# Patient Record
Sex: Female | Born: 1965 | Race: White | Hispanic: Yes | Smoking: Never smoker
Health system: Southern US, Community
[De-identification: ages and names within clinical notes are randomized; demographics above are authoritative.]

## PROBLEM LIST (undated history)

## (undated) HISTORY — PX: TUBAL LIGATION: SHX77

---

## 2014-11-16 ENCOUNTER — Other Ambulatory Visit: Payer: Self-pay | Admitting: Obstetrics and Gynecology

## 2014-11-16 DIAGNOSIS — Z1231 Encounter for screening mammogram for malignant neoplasm of breast: Secondary | ICD-10-CM

## 2015-01-05 ENCOUNTER — Ambulatory Visit (HOSPITAL_COMMUNITY)
Admission: RE | Admit: 2015-01-05 | Discharge: 2015-01-05 | Disposition: A | Discharge status: Home / Self Care | Payer: Self-pay | Source: Ambulatory Visit | Attending: Obstetrics and Gynecology | Admitting: Obstetrics and Gynecology

## 2015-01-05 ENCOUNTER — Encounter (HOSPITAL_COMMUNITY): Payer: Self-pay

## 2015-01-05 VITALS — BP 108/70 | Temp 97.9°F | Ht 63.0 in | Wt 131.6 lb

## 2015-01-05 DIAGNOSIS — Z1239 Encounter for other screening for malignant neoplasm of breast: Secondary | ICD-10-CM

## 2015-01-05 DIAGNOSIS — Z1231 Encounter for screening mammogram for malignant neoplasm of breast: Secondary | ICD-10-CM

## 2015-01-05 NOTE — Progress Notes (Signed)
No complaints today.  Pap Smear:  Pap smear not completed today. Last Pap smear was 11/07/2014 at the free cervical cancer screening at the [facility] and normal. Per patient has no history of an abnormal Pap smear. No Pap smear results in EPIC.  Physical exam: Breasts Breasts symmetrical. No skin abnormalities bilateral breasts. No nipple retraction bilateral breasts. No nipple discharge bilateral breasts. No lymphadenopathy. No lumps palpated bilateral breasts. No complaints of pain or tenderness on exam. Patient escorted to mammography for a screening mammogram.        Pelvic/Bimanual No Pap smear completed today since last Pap smear was 11/07/2014. Pap smear not indicated per BCCCP guidelines.

## 2015-01-05 NOTE — Patient Instructions (Signed)
Explained to Jamie ShoresMaria Bates that she did not need a Pap smear today due to last Pap smear was 11/07/2014. Let her know BCCCP will cover Pap smears every 3 years unless has a history of abnormal Pap smears. Let patient know the Breast Center will follow up with her within the next couple weeks with results of mammogram by letter or phone. Jamie ShoresMaria Schlee verbalized understanding. Patient escorted to mammography for a screening mammogram.  Leonie Amacher, Kathaleen Maserhristine Poll, RN 11:45 AM

## 2015-01-15 ENCOUNTER — Encounter (HOSPITAL_COMMUNITY): Payer: Self-pay

## 2016-01-09 ENCOUNTER — Other Ambulatory Visit: Payer: Self-pay | Admitting: Obstetrics and Gynecology

## 2016-01-09 DIAGNOSIS — Z1231 Encounter for screening mammogram for malignant neoplasm of breast: Secondary | ICD-10-CM

## 2016-01-11 ENCOUNTER — Ambulatory Visit
Admission: RE | Admit: 2016-01-11 | Discharge: 2016-01-11 | Disposition: A | Discharge status: Home / Self Care | Payer: No Typology Code available for payment source | Source: Ambulatory Visit | Attending: Obstetrics and Gynecology | Admitting: Obstetrics and Gynecology

## 2016-01-11 ENCOUNTER — Encounter (HOSPITAL_COMMUNITY): Payer: Self-pay

## 2016-01-11 ENCOUNTER — Ambulatory Visit (HOSPITAL_COMMUNITY)
Admission: RE | Admit: 2016-01-11 | Discharge: 2016-01-11 | Disposition: A | Discharge status: Home / Self Care | Payer: Self-pay | Source: Ambulatory Visit | Attending: Obstetrics and Gynecology | Admitting: Obstetrics and Gynecology

## 2016-01-11 VITALS — BP 110/68 | Temp 97.8°F | Ht 63.0 in | Wt 140.0 lb

## 2016-01-11 DIAGNOSIS — Z1239 Encounter for other screening for malignant neoplasm of breast: Secondary | ICD-10-CM

## 2016-01-11 DIAGNOSIS — Z1231 Encounter for screening mammogram for malignant neoplasm of breast: Secondary | ICD-10-CM

## 2016-01-11 NOTE — Patient Instructions (Signed)
Educational materials on breast self awareness given. Explained to Jamie ShoresMaria Bates that she did not need a Pap smear today due to last Pap smear was 11/07/2014. Let her know BCCCP will cover Pap smears and HPV typing every 5 years unless has a history of abnormal Pap smears. [facility]. Appointment scheduled for Thursday, January 11, 2016 at 1030. Patient aware of appointment and will be there. Let patient know the Breast Center will follow up with her within the next couple weeks with results of mammogram by letter or phone. Jamie ShoresMaria Bates verbalized understanding.  Toshie Demelo, Kathaleen Maserhristine Poll, RN 10:02 AM

## 2016-01-11 NOTE — Progress Notes (Signed)
No complaints today.  Pap Smear:  Pap smear not completed today. Last Pap smear was 11/07/2014 at the free [facility] cancer screening [facility] and normal. Per patient has no history of an abnormal Pap smear. No Pap smear results in EPIC.  Physical exam: Breasts Breasts symmetrical. No skin abnormalities bilateral breasts. No nipple retraction bilateral breasts. No nipple discharge bilateral breasts. No lymphadenopathy. No lumps palpated bilateral breasts. No complaints of pain or tenderness on exam. [facility] mamm[facility]. Appointment scheduled for Thursday, January 11, 2016 at 1030.        Pelvic/Bimanual No Pap smear completed today since last Pap smear was 11/07/2014. Pap smear not indicated per BCCCP guidelines.   Smoking History: Patient has never smoked.  Patient Navigation: Patient education provided. [facility].

## 2016-01-12 ENCOUNTER — Other Ambulatory Visit: Payer: Self-pay | Admitting: Obstetrics and Gynecology

## 2016-01-12 DIAGNOSIS — R928 Other abnormal and inconclusive findings on diagnostic imaging of breast: Secondary | ICD-10-CM

## 2016-01-30 ENCOUNTER — Ambulatory Visit
Admission: RE | Admit: 2016-01-30 | Discharge: 2016-01-30 | Disposition: A | Discharge status: Home / Self Care | Payer: No Typology Code available for payment source | Source: Ambulatory Visit | Attending: Obstetrics and Gynecology | Admitting: Obstetrics and Gynecology

## 2016-01-30 DIAGNOSIS — R928 Other abnormal and inconclusive findings on diagnostic imaging of breast: Secondary | ICD-10-CM

## 2016-02-28 ENCOUNTER — Telehealth: Payer: Self-pay

## 2016-02-28 NOTE — Telephone Encounter (Signed)
Called per interpreter Marly Adams to see if interested in [facility] Program. Left message. 

## 2016-02-29 ENCOUNTER — Telehealth: Payer: Self-pay

## 2016-02-29 NOTE — Telephone Encounter (Signed)
Called and reminded of appointment at [facility] at 8:45 AM.

## 2016-03-29 ENCOUNTER — Other Ambulatory Visit: Payer: Self-pay

## 2016-03-29 ENCOUNTER — Ambulatory Visit: Payer: Self-pay

## 2016-03-29 VITALS — BP 106/76 | HR 64 | Temp 97.7°F | Resp 14 | Ht 63.0 in | Wt 134.1 lb

## 2016-03-29 DIAGNOSIS — Z0000 Encounter for general adult medical examination without abnormal findings: Secondary | ICD-10-CM

## 2016-03-29 NOTE — Progress Notes (Signed)
[facility] and is currently a [facility] patient effective 01/11/2016 with interpreter Jamie Bates.   Clinical Measurements: Patient is 5 ft. 3 inches, weight 134.1 lbs, and BMI 23.8.   Medical History: Patient has no history of high cholesterol. Patient does not have a history of hypertension or diabetes. Per patient no diagnosed history of coronary heart disease, heart attack, heart failure, stroke/TIA, vascular disease or congenital heart defects.   Blood Pressure, Self-measurement: Patient states has no reason to check Blood pressure.  Nutrition Assessment: Patient stated that eats 2 to 3 fruits every day. Patient states she eats 2 servings of vegetables a day. Per patient states does eat 3 or more ounces of whole grains daily. Patient stated doesn't eat two or more servings of fish weekly. Patient says that does not like fish but when explored more does eat tuna, salmon and sardines on occasion. Patient states she does not drink more than 36 ounces or 450 calories of beverages with added sugars weekly. Patient stated she does not watch her salt intake.   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 Patient states when works is  On her feet a lot standing. Per patient is noticing varicose veins beginning. Informed patient that needs to wear some type of support stockings/hose. Explained that [employer] has a pair of energy support panty hosiery (stockings).  Smoking Status: Patient has never smoked and is not exposed to smoke.   Quality of Life Assessment: In assessing patient's quality of life she stated that out of the past 30 days that she has felt her health is good all of them. Patient also stated that in the past 30 days that her mental health was good including stress, depression and problems with emotions for all days. Patient did state that out of  the past 30 days she felt her physical or mental health had not kept her from doing her usual activities including self-care, work or recreation.   Plan: Lab work will be done today including a lipid panel, blood glucose, and Hgb A1C. Will call lab results when they are finished. Will discuss risk reduction health coaching when call results.Will help find resource for eye glasses.

## 2016-03-29 NOTE — Patient Instructions (Signed)
Discussed health assessment with patient.  She will be called with results of lab work and we will then discussed any further follow up the patient needs. Will get pair support hosiery. Patient verbalized understanding.

## 2016-03-30 LAB — HEMOGLOBIN A1C
ESTIMATED AVERAGE GLUCOSE: 108 mg/dL
HEMOGLOBIN A1C: 5.4 % (ref 4.8–5.6)

## 2016-03-30 LAB — LIPID PANEL
CHOL/HDL RATIO: 2.2 ratio (ref 0.0–4.4)
Cholesterol, Total: 226 mg/dL — ABNORMAL HIGH (ref 100–199)
HDL: 102 mg/dL (ref >39)
LDL Calculated: 109 mg/dL — ABNORMAL HIGH (ref 0–99)
TRIGLYCERIDES: 77 mg/dL (ref 0–149)
VLDL Cholesterol Cal: 15 mg/dL (ref 5–40)

## 2016-04-01 ENCOUNTER — Telehealth: Payer: Self-pay

## 2016-04-01 NOTE — Telephone Encounter (Signed)
Called to give lab results and Health Coaching. Left message for patient to return call.

## 2016-04-01 NOTE — Telephone Encounter (Signed)
Patient was called on 4/3 per Maretta LosBlanca Lindner interpreter to inform her about lab work from 03/29/16.  LAB RESULTS : Interpreter, Maretta LosBlanca Lindner informed patient: BMI 23.8, cholesterol- 226, HDL- 102, LDL- 109, triglycerides - 77 and HBG-A1C - 5.4.    RISK REDUCTION  HEALTH COACHING:Did risk reduction counseling concerning Total Cholesterol and LDL. Discussed increasing fiber in diet and exercise. Discussed certain type of fruits and vegetables that high in fiber. Discussed not eating fatty meats. Will call back to see if wants to continue Health Coaching in person or by phone concerning nutrition and exercise.

## 2016-06-25 ENCOUNTER — Telehealth: Payer: Self-pay

## 2016-06-25 NOTE — Telephone Encounter (Signed)
Attempted to call for second health coaching session and reached voice mail. Message left on voicemail to return call with phone number.

## 2017-01-17 ENCOUNTER — Other Ambulatory Visit: Payer: Self-pay | Admitting: Obstetrics and Gynecology

## 2017-01-17 DIAGNOSIS — Z1231 Encounter for screening mammogram for malignant neoplasm of breast: Secondary | ICD-10-CM

## 2017-01-26 IMAGING — MG MM DIAG BREAST TOMO UNI LEFT
4 series · 4 of 12 positions shown · non-contrast
Comparison: Previous exams including recent screening mammogram
dated 01/11/2016.

CLINICAL DATA: Patient returns today to evaluate possible left
breast masses identified on recent screening mammogram.

EXAM:
DIGITAL DIAGNOSTIC LEFT MAMMOGRAM WITH 3D TOMOSYNTHESIS WITH CAD
ULTRASOUND LEFT BREAST

[L CC]
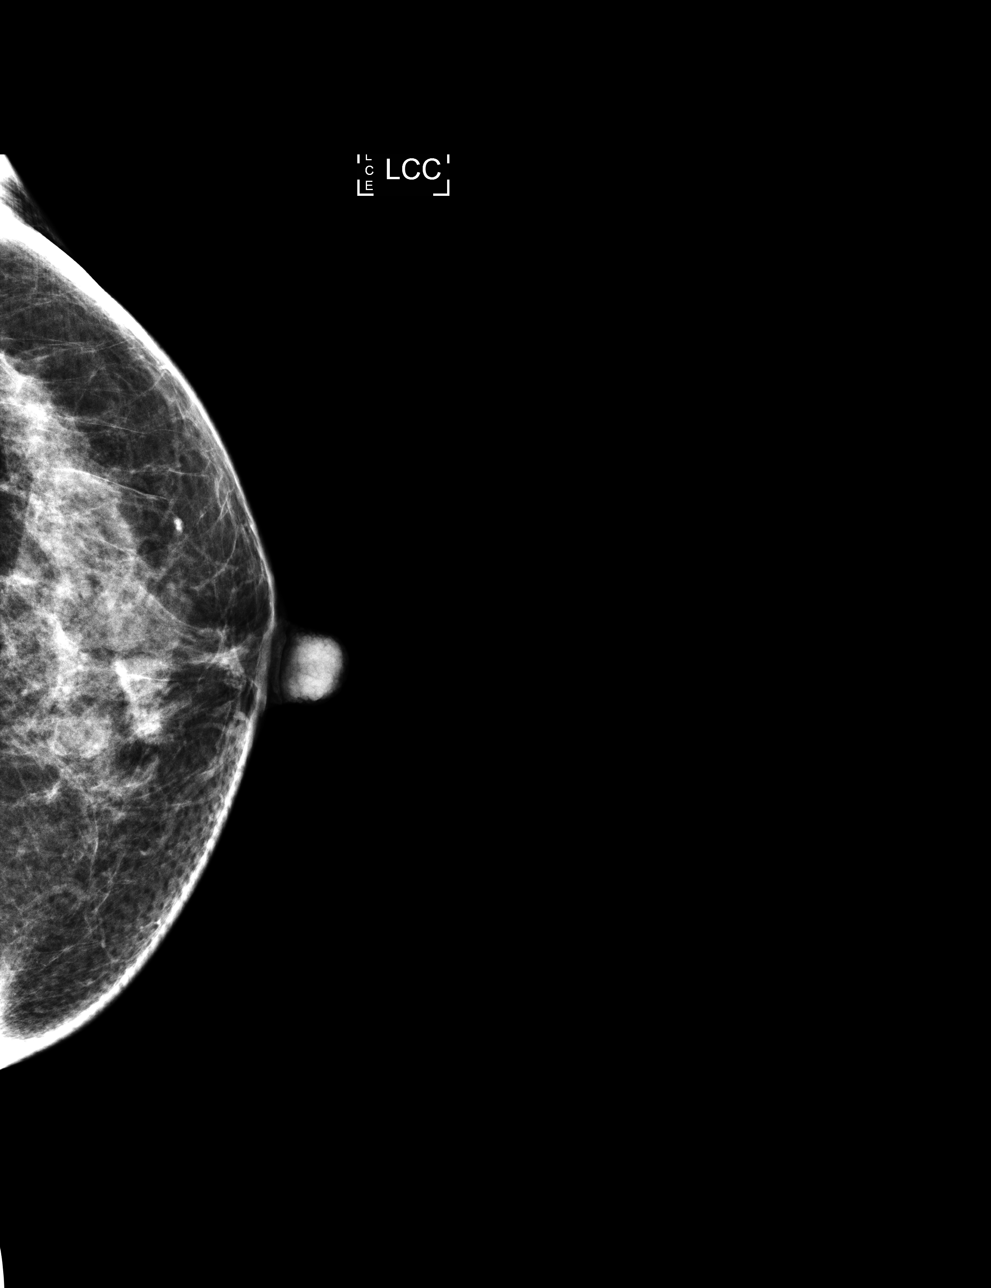

[L MLO]
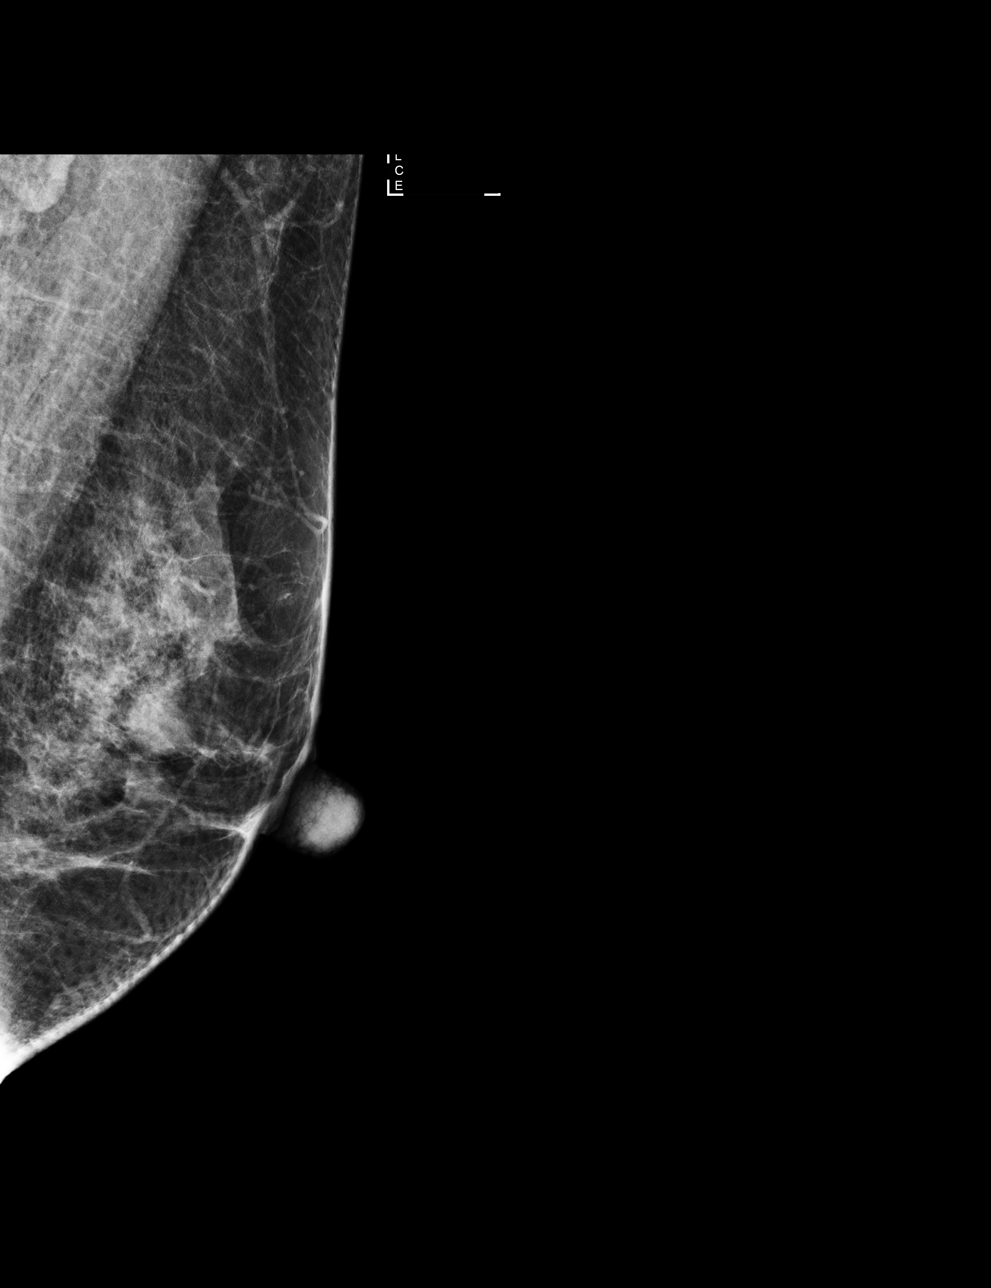

[L CC tomo · tomo slice 29/56.0]
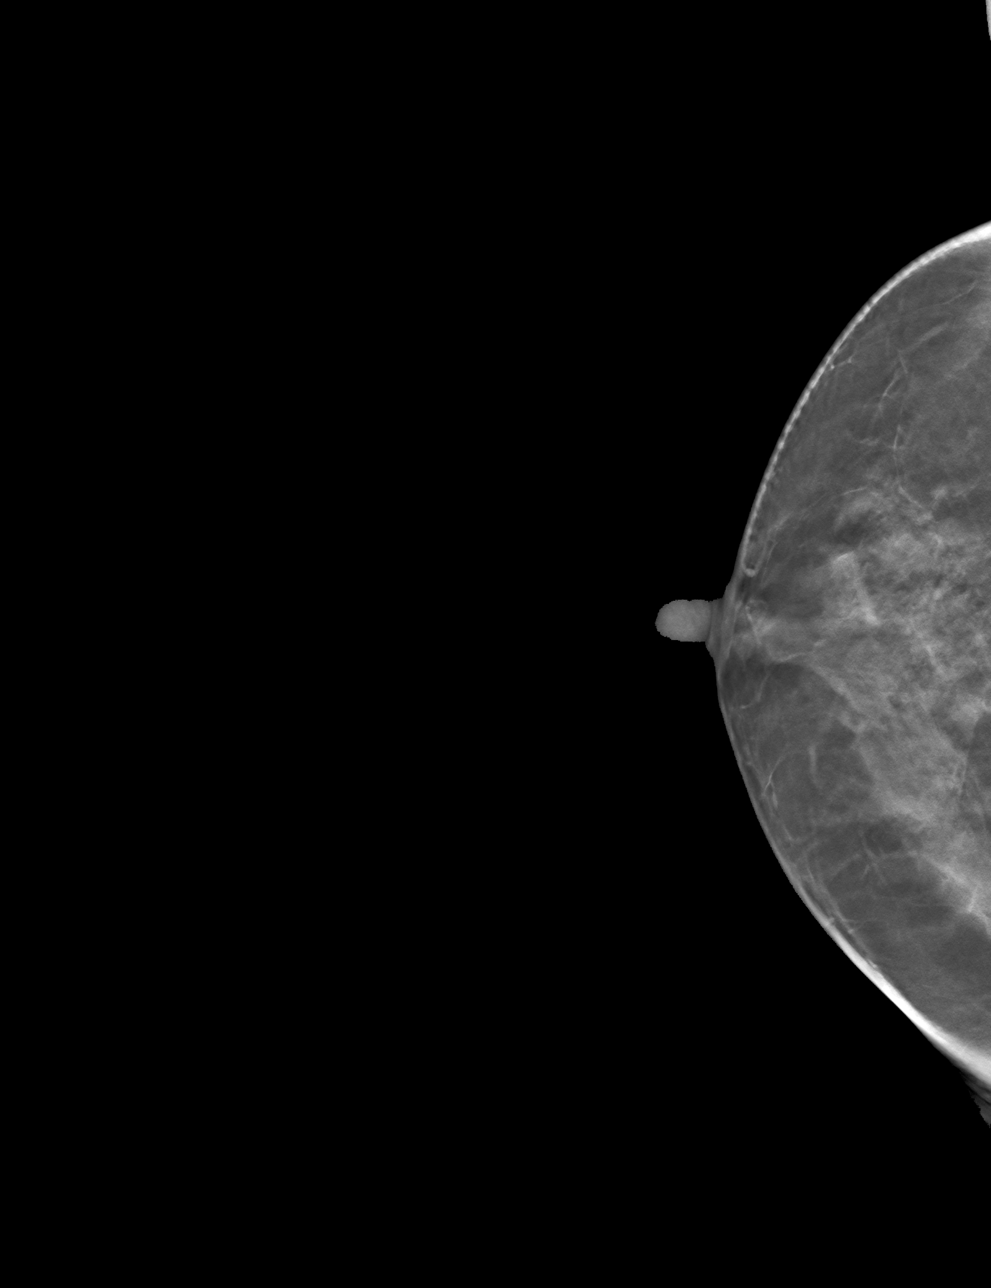

[L MLO tomo · tomo slice 30/59.0]
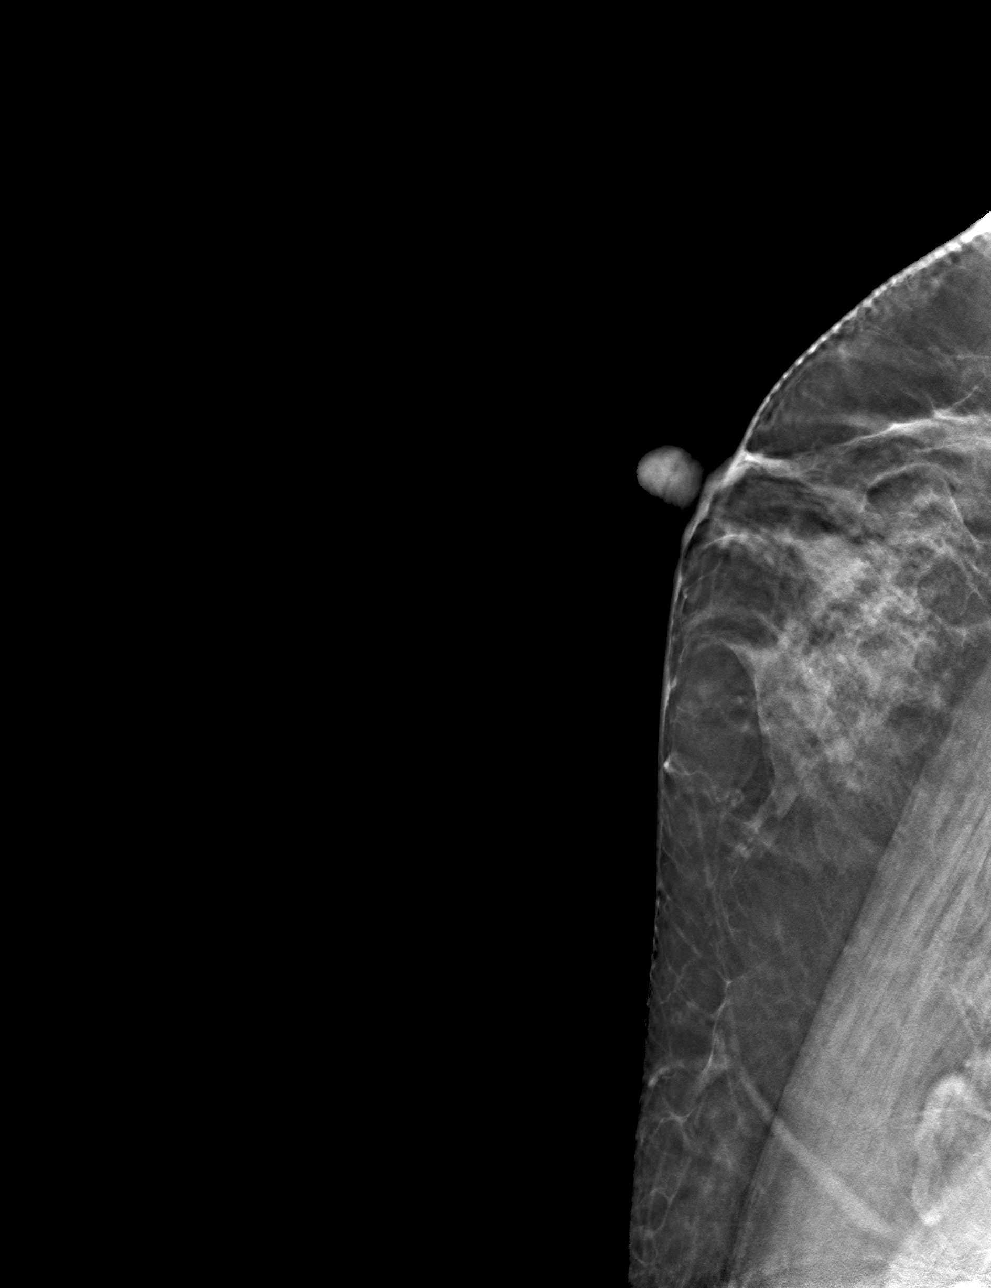

[4 of 12 positions shown; findings below may reference images not displayed]

ACR Breast Density Category c: The breast tissue is heterogeneously
dense, which may obscure small masses.
FINDINGS: On today's additional views with 3D tomosynthesis, there are
multiple round and oval circumscribed masses confirmed within the
upper and central left breast, middle to posterior depth, largest
measuring approximately 10 mm greatest dimension.

Mammographic images were processed with CAD.

Targeted ultrasound is performed, showing multiple benign cysts
within the upper and central left breast, largest measuring 10 mm
greatest dimension, corresponding to the mammographic findings.
There are no suspicious solid or cystic masses identified within the
upper left breast by ultrasound.
IMPRESSION: No evidence of malignancy within the left breast. Multiple benign
cysts within the upper and central left breast.

Patient may return to routine annual bilateral screening mammogram
schedule.

RECOMMENDATION:
Screening mammogram in one year.(Code:NC-V-LLH)

I have discussed the findings and recommendations with the patient.
Results were also provided in writing at the conclusion of the
visit. If applicable, a reminder letter will be sent to the patient
regarding the next appointment.

BI-RADS CATEGORY  2: Benign.

## 2017-01-26 IMAGING — US US BREAST LTD UNI LEFT INC AXILLA
1 series · 13 of 19 positions shown · non-contrast
Comparison: Previous exams including recent screening mammogram
dated 01/11/2016.

CLINICAL DATA: Patient returns today to evaluate possible left
breast masses identified on recent screening mammogram.

EXAM:
DIGITAL DIAGNOSTIC LEFT MAMMOGRAM WITH 3D TOMOSYNTHESIS WITH CAD
ULTRASOUND LEFT BREAST

[Series 1: us breast ltd uni left inc axilla · 0.07mm/px · 13 of 19 slices shown]
[im 1/19]
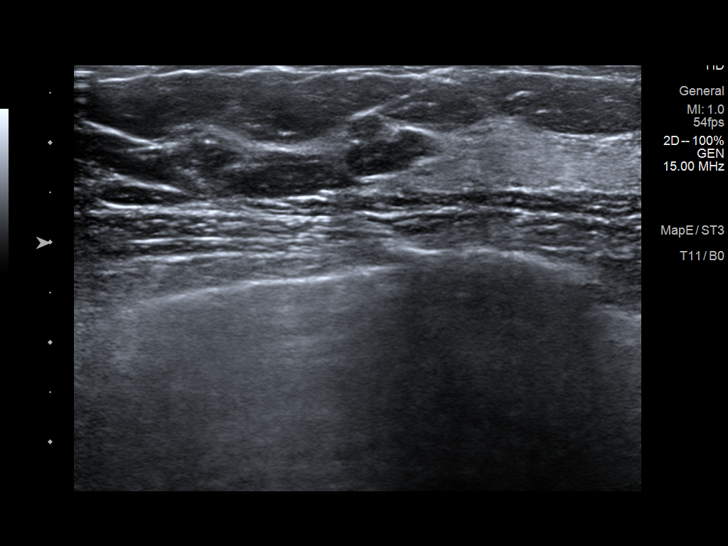
[im 3/19]
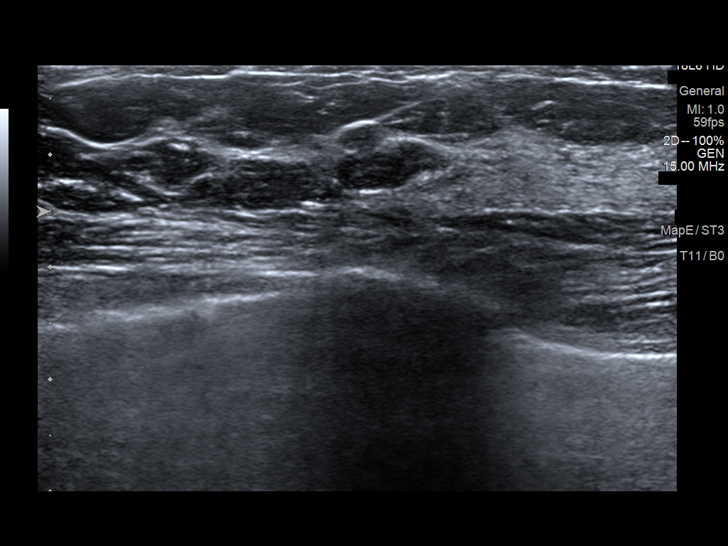
[im 4/19]
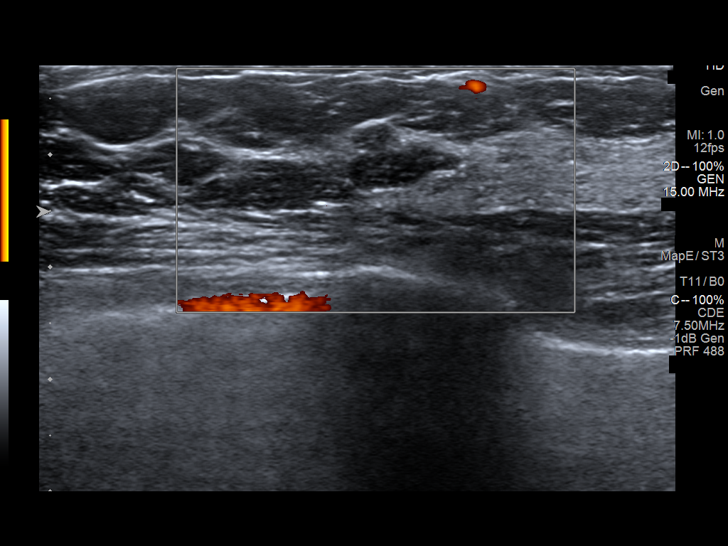
[im 6/19]
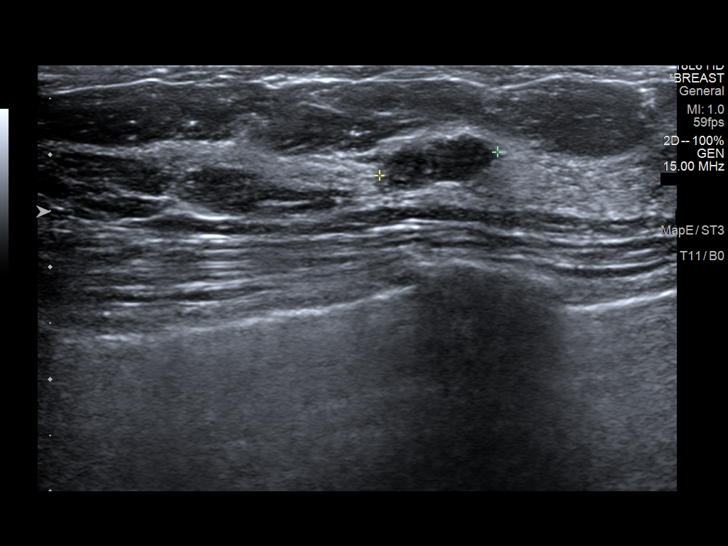
[im 7/19]
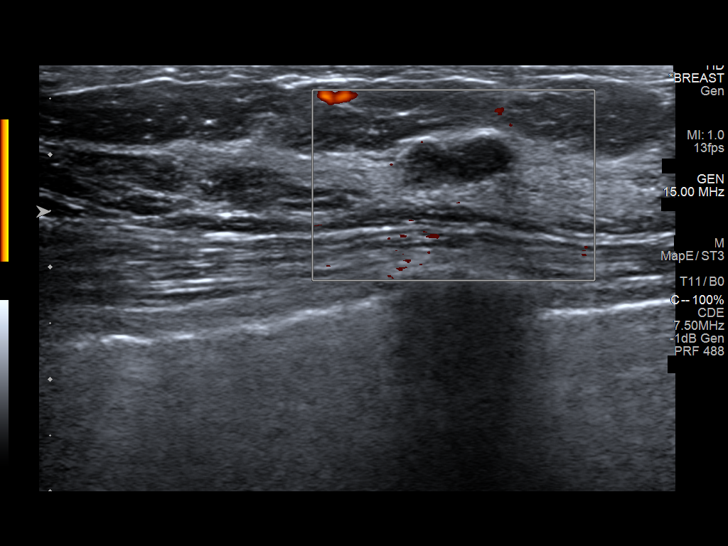
[im 9/19]
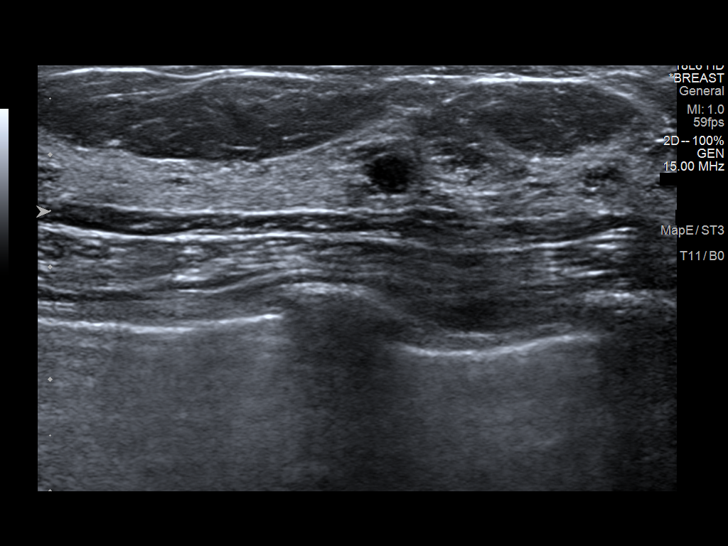
[im 10/19]
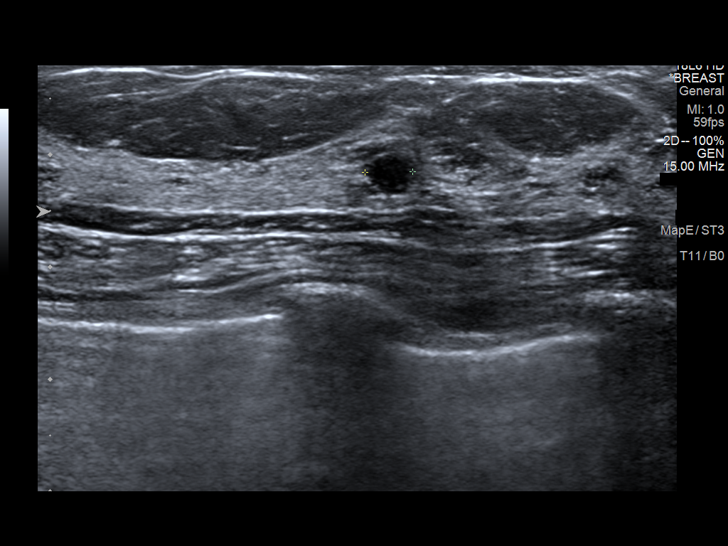
[im 11/19]
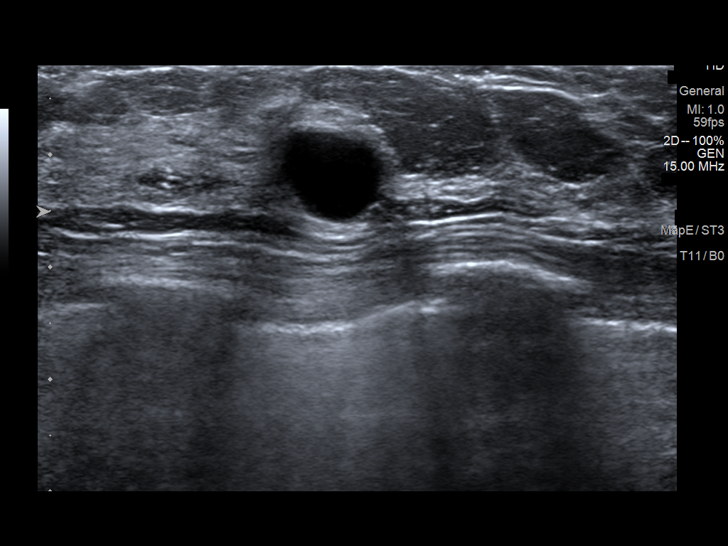
[im 13/19]
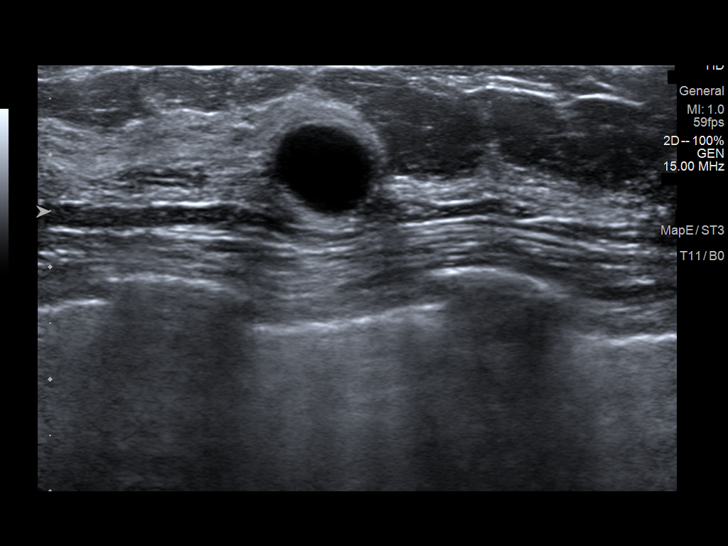
[im 14/19]
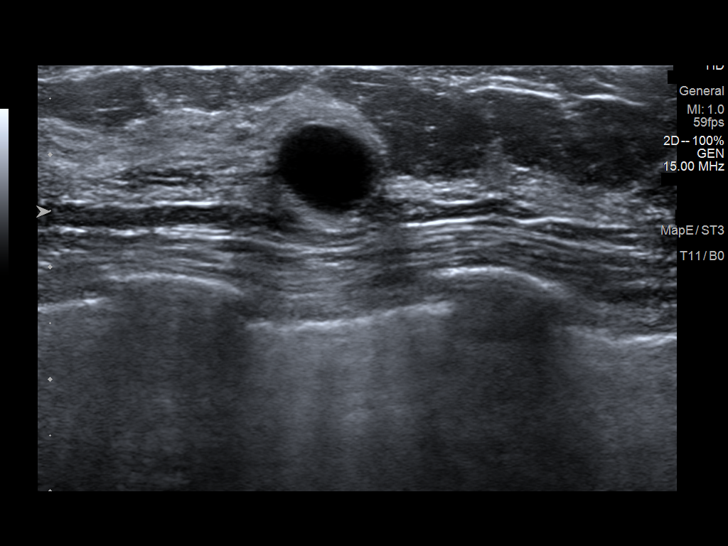
[im 16/19]
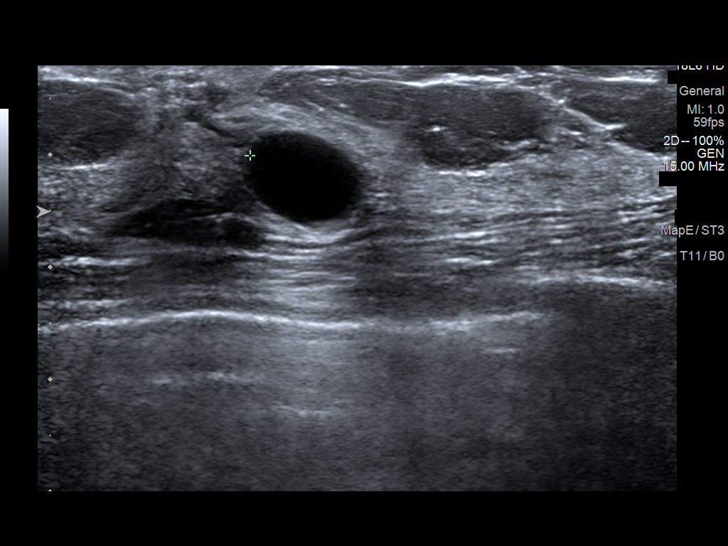
[im 17/19]
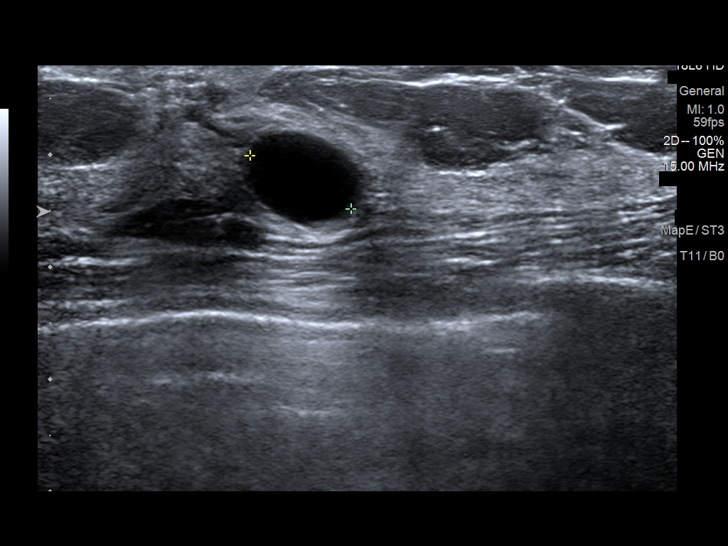
[im 19/19]
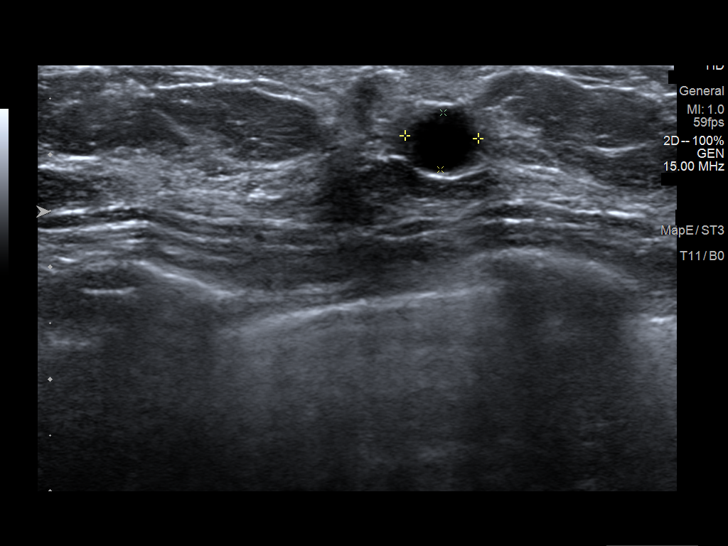

[13 of 19 positions shown; findings below may reference images not displayed]

ACR Breast Density Category c: The breast tissue is heterogeneously
dense, which may obscure small masses.
FINDINGS: On today's additional views with 3D tomosynthesis, there are
multiple round and oval circumscribed masses confirmed within the
upper and central left breast, middle to posterior depth, largest
measuring approximately 10 mm greatest dimension.

Mammographic images were processed with CAD.

Targeted ultrasound is performed, showing multiple benign cysts
within the upper and central left breast, largest measuring 10 mm
greatest dimension, corresponding to the mammographic findings.
There are no suspicious solid or cystic masses identified within the
upper left breast by ultrasound.
IMPRESSION: No evidence of malignancy within the left breast. Multiple benign
cysts within the upper and central left breast.

Patient may return to routine annual bilateral screening mammogram
schedule.

RECOMMENDATION:
Screening mammogram in one year.(Code:NC-[id number])

I have discussed the findings and recommendations with the patient.
Results were also provided in writing at the conclusion of the
visit. If applicable, a reminder letter will be sent to the patient
regarding the next appointment.

BI-RADS CATEGORY  2: Benign.

## 2017-01-30 ENCOUNTER — Ambulatory Visit (HOSPITAL_COMMUNITY)
Admission: RE | Admit: 2017-01-30 | Discharge: 2017-01-30 | Disposition: A | Discharge status: Home / Self Care | Payer: No Typology Code available for payment source | Source: Ambulatory Visit | Attending: Obstetrics and Gynecology | Admitting: Obstetrics and Gynecology

## 2017-01-30 ENCOUNTER — Encounter (HOSPITAL_COMMUNITY): Payer: Self-pay | Admitting: *Deleted

## 2017-01-30 ENCOUNTER — Ambulatory Visit
Admission: RE | Admit: 2017-01-30 | Discharge: 2017-01-30 | Disposition: A | Discharge status: Home / Self Care | Payer: No Typology Code available for payment source | Source: Ambulatory Visit | Attending: Obstetrics and Gynecology | Admitting: Obstetrics and Gynecology

## 2017-01-30 ENCOUNTER — Encounter: Payer: Self-pay | Admitting: Pediatric Intensive Care

## 2017-01-30 VITALS — BP 138/90 | Temp 97.7°F | Ht 64.5 in | Wt 151.4 lb

## 2017-01-30 DIAGNOSIS — Z1239 Encounter for other screening for malignant neoplasm of breast: Secondary | ICD-10-CM

## 2017-01-30 DIAGNOSIS — Z1231 Encounter for screening mammogram for malignant neoplasm of breast: Secondary | ICD-10-CM

## 2017-01-30 DIAGNOSIS — I10 Essential (primary) hypertension: Secondary | ICD-10-CM

## 2017-01-30 LAB — GLUCOSE, POCT (MANUAL RESULT ENTRY): POC Glucose: 78 mg/dl (ref 70–99)

## 2017-01-30 NOTE — Progress Notes (Signed)
No complaints today.   Pap Smear: Pap smear not completed today. Last Pap smear was 11/07/2014 at the [facility] screening [facility] and normal. Per patient has no history of an abnormal Pap smear. No Pap smear results in EPIC.  Physical exam: Breasts Breasts symmetrical. No skin abnormalities bilateral breasts. No nipple retraction bilateral breasts. No nipple discharge bilateral breasts. No lymphadenopathy. No lumps palpated bilateral breasts. No complaints of pain or tenderness on exam. [facility]. Appointment scheduled for Thursday, January 30, 2017 at 0910.        Pelvic/Bimanual No Pap smear completed today since last Pap smear was 11/07/2014. Pap smear not indicated per BCCCP guidelines.   Smoking History: Patient has never smoked.  Patient Navigation: Patient education provided. [facility]. Spanish interpreter provided.  Used Spanish interpreter [employer] McReynolds from [employer].

## 2017-01-30 NOTE — Patient Instructions (Signed)
Explained breast self awareness to [name]. Patient did not need a Pap smear today due to last Pap smear was 11/07/2014. Let her know BCCCP will cover Pap smears every 3 years unless has a history of abnormal Pap smears. Reminded her that her Pap smear is due in November 2018. Let her know that she can have her Pap smear completed in BCCCP. Told her she will need to call and schedule appointment a couple months prior to when due. [facility]. Appointment scheduled for Thursday, January 30, 2017 at 0910. Let patient know the Breast Center will follow up with her within the next couple weeks with results of mammogram by letter or phone. [name] verbalized understanding.  [place], Kathaleen Maserhristine Poll, RN 8:45 AM

## 2017-01-31 ENCOUNTER — Encounter (HOSPITAL_COMMUNITY): Payer: Self-pay | Admitting: *Deleted

## 2017-03-07 NOTE — Congregational Nurse Program (Signed)
Congregational Nurse Program Note  Date of Encounter: 01/30/2017  Past Medical History: No past medical history on file.  Encounter Details:  Via interpreter Evea- client would like blood pressure and blood glucose check. Client has valid orange Card and has been seen at [facility] in the past. CN counseled to follow up with PCP.

## 2017-10-13 ENCOUNTER — Encounter (HOSPITAL_COMMUNITY): Payer: Self-pay

## 2017-12-15 ENCOUNTER — Telehealth: Payer: Self-pay | Admitting: *Deleted

## 2017-12-15 NOTE — Telephone Encounter (Signed)
Chart opened in error
